# Patient Record
Sex: Female | Born: 1990 | Race: White | Hispanic: No | Marital: Single | Smoking: Never smoker
Health system: Southern US, Community
[De-identification: ages and names within clinical notes are randomized; demographics above are authoritative.]

## PROBLEM LIST (undated history)

## (undated) DIAGNOSIS — J45909 Unspecified asthma, uncomplicated: Secondary | ICD-10-CM

## (undated) DIAGNOSIS — M419 Scoliosis, unspecified: Secondary | ICD-10-CM

## (undated) DIAGNOSIS — B999 Unspecified infectious disease: Secondary | ICD-10-CM

## (undated) HISTORY — PX: TYMPANOSTOMY TUBE PLACEMENT: SHX32

---
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
## 2015-02-07 LAB — OB RESULTS CONSOLE GC/CHLAMYDIA
Chlamydia: NEGATIVE
Gonorrhea: NEGATIVE

## 2015-02-07 LAB — OB RESULTS CONSOLE RUBELLA ANTIBODY, IGM: RUBELLA: IMMUNE

## 2015-02-07 LAB — OB RESULTS CONSOLE RPR: RPR: NONREACTIVE

## 2015-02-07 LAB — OB RESULTS CONSOLE ABO/RH: RH TYPE: POSITIVE

## 2015-02-07 LAB — OB RESULTS CONSOLE HIV ANTIBODY (ROUTINE TESTING): HIV: NONREACTIVE

## 2015-02-07 LAB — OB RESULTS CONSOLE HEPATITIS B SURFACE ANTIGEN: HEP B S AG: NEGATIVE

## 2015-02-07 LAB — OB RESULTS CONSOLE ANTIBODY SCREEN: Antibody Screen: NEGATIVE

## 2015-03-22 LAB — OB RESULTS CONSOLE GBS: GBS: NEGATIVE

## 2015-04-01 NOTE — L&D Delivery Note (Signed)
Delivery Note-called for delivery per Dr. Seymour BarsLavoie request  First Stage: Labor onset: 1600 Augmentation: Pitocin Analgesia Emily Perkins/Anesthesia intrapartum: epidural SROM at 2303-clear  Second Stage: Complete dilation at 0410 Onset of pushing at 0426 FHR second stage 150 baseline, variable decels, Cat II  In maternal lithotomy, delivery of a viable female at [id number] by CNM in LOP position with restitution to OP No nuchal cord Cord double clamped after cessation of pulsation, cut by family member Cord blood sample collected   Collection of cord blood donation n/a Arterial cord blood sample n/a  Third Stage: Placenta delivered via Tomasa BlaseSchultz intact with 3 VC @ 0538 Placenta disposition: routine disposal Uterine tone firm / bleeding small  2nd degree perineal laceration identified  Anesthesia for repair: epidural Repaired with 2-0 Vicryl rapide Est. Blood Loss (mL): 150  Complications: none  Mom to postpartum.  Baby to Couplet care / Skin to Skin.  Newborn: Birth Weight: pending Apgar Scores: 9/9 Feeding planned: bottle  Emily Perkins, Emily Perkins, N MSN, CNM 04/16/2015, 5:54 AM

## 2015-04-12 ENCOUNTER — Other Ambulatory Visit: Payer: Self-pay | Admitting: Obstetrics & Gynecology

## 2015-04-14 ENCOUNTER — Inpatient Hospital Stay (HOSPITAL_COMMUNITY)
Admission: AD | Admit: 2015-04-14 | Discharge: 2015-04-14 | Disposition: A | Discharge status: Home / Self Care | Payer: BLUE CROSS/BLUE SHIELD | Source: Ambulatory Visit | Attending: Obstetrics and Gynecology | Admitting: Obstetrics and Gynecology

## 2015-04-14 ENCOUNTER — Encounter (HOSPITAL_COMMUNITY): Payer: Self-pay | Admitting: *Deleted

## 2015-04-14 HISTORY — DX: Unspecified asthma, uncomplicated: J45.909

## 2015-04-14 HISTORY — DX: Scoliosis, unspecified: M41.9

## 2015-04-14 NOTE — OB Triage Note (Signed)
Emily Perkins is a 25 y.o. G1P0 at 6737w3d by LMP for labor evaluation  Subjective: Chief Complaint  Patient presents with  . Contractions    Objective: BP 133/77 mmHg  Pulse 82  Temp(Src) 97.9 F (36.6 C)  Resp 18  Ht 5\' 5"  (1.651 m)  Wt 71.215 kg (157 lb)  BMI 26.13 kg/m2      FHT:  FHR: 125 bpm, variability: moderate,  accelerations:  Present,  decelerations:  Absent UC:   rare SVE:   2 cm dilated, 80 effaced, -2 station  Labs: No results found for: WBC, HGB, HCT, MCV, PLT  Assessment / Plan: Prodromal labor  Labor: not in labor Fetal Wellbeing:  Category I Pain Control:  Labor support without medications Anticipated MOD:  dc home with precautions.  Emily Perkins J 04/14/2015, 10:10 PM

## 2015-04-14 NOTE — MAU Note (Signed)
Some contractions since yesterday but having a lot of lower back pain tonight. Denies LOF or bleeding. Some white d/c

## 2015-04-15 ENCOUNTER — Inpatient Hospital Stay (HOSPITAL_COMMUNITY): Payer: BLUE CROSS/BLUE SHIELD | Admitting: Anesthesiology

## 2015-04-15 ENCOUNTER — Inpatient Hospital Stay (HOSPITAL_COMMUNITY)
Admission: AD | Admit: 2015-04-15 | Discharge: 2015-04-18 | DRG: 775 | Disposition: A | Discharge status: Home / Self Care | Payer: BLUE CROSS/BLUE SHIELD | Source: Ambulatory Visit | Attending: Obstetrics & Gynecology | Admitting: Obstetrics & Gynecology

## 2015-04-15 ENCOUNTER — Encounter (HOSPITAL_COMMUNITY): Payer: Self-pay | Admitting: *Deleted

## 2015-04-15 DIAGNOSIS — Z822 Family history of deafness and hearing loss: Secondary | ICD-10-CM

## 2015-04-15 DIAGNOSIS — O9952 Diseases of the respiratory system complicating childbirth: Secondary | ICD-10-CM | POA: Diagnosis present

## 2015-04-15 DIAGNOSIS — M419 Scoliosis, unspecified: Secondary | ICD-10-CM | POA: Diagnosis present

## 2015-04-15 DIAGNOSIS — D62 Acute posthemorrhagic anemia: Secondary | ICD-10-CM | POA: Diagnosis not present

## 2015-04-15 DIAGNOSIS — IMO0001 Reserved for inherently not codable concepts without codable children: Secondary | ICD-10-CM

## 2015-04-15 DIAGNOSIS — O9081 Anemia of the puerperium: Secondary | ICD-10-CM | POA: Diagnosis not present

## 2015-04-15 DIAGNOSIS — Z3A39 39 weeks gestation of pregnancy: Secondary | ICD-10-CM | POA: Diagnosis not present

## 2015-04-15 DIAGNOSIS — J45909 Unspecified asthma, uncomplicated: Secondary | ICD-10-CM | POA: Diagnosis present

## 2015-04-15 HISTORY — DX: Unspecified infectious disease: B99.9

## 2015-04-15 LAB — TYPE AND SCREEN
ABO/RH(D): O POS
ANTIBODY SCREEN: NEGATIVE

## 2015-04-15 LAB — CBC
HCT: 35.1 % — ABNORMAL LOW (ref 36.0–46.0)
Hemoglobin: 12 g/dL (ref 12.0–15.0)
MCH: 28.4 pg (ref 26.0–34.0)
MCHC: 34.2 g/dL (ref 30.0–36.0)
MCV: 83 fL (ref 78.0–100.0)
PLATELETS: 283 10*3/uL (ref 150–400)
RBC: 4.23 MIL/uL (ref 3.87–5.11)
RDW: 15.1 % (ref 11.5–15.5)
WBC: 15.1 10*3/uL — AB (ref 4.0–10.5)

## 2015-04-15 MED ORDER — CITRIC ACID-SODIUM CITRATE 334-500 MG/5ML PO SOLN: 30.0000 mL | ORAL | Status: DC | PRN

## 2015-04-15 MED ORDER — LACTATED RINGERS IV SOLN: 500.0000 mL | INTRAVENOUS | Status: DC | PRN

## 2015-04-15 MED ORDER — LACTATED RINGERS IV SOLN
INTRAVENOUS | Status: DC
  Administered 2015-04-15: 19:00:00 via INTRAVENOUS

## 2015-04-15 MED ORDER — OXYTOCIN 10 UNIT/ML IJ SOLN
2.5000 [IU]/h | INTRAVENOUS | Status: DC
  Administered 2015-04-16: 2.5 [IU]/h via INTRAVENOUS
  Filled 2015-04-15: qty 4

## 2015-04-15 MED ORDER — LIDOCAINE HCL (PF) 1 % IJ SOLN
INTRAMUSCULAR | Status: DC | PRN
  Administered 2015-04-15 (×2): 5 mL

## 2015-04-15 MED ORDER — DIPHENHYDRAMINE HCL 50 MG/ML IJ SOLN: 12.5000 mg | INTRAMUSCULAR | Status: DC | PRN

## 2015-04-15 MED ORDER — OXYCODONE-ACETAMINOPHEN 5-325 MG PO TABS: 2.0000 | ORAL_TABLET | ORAL | Status: DC | PRN

## 2015-04-15 MED ORDER — LIDOCAINE HCL (PF) 1 % IJ SOLN
30.0000 mL | INTRAMUSCULAR | Status: DC | PRN
  Filled 2015-04-15: qty 30

## 2015-04-15 MED ORDER — OXYCODONE-ACETAMINOPHEN 5-325 MG PO TABS: 1.0000 | ORAL_TABLET | ORAL | Status: DC | PRN

## 2015-04-15 MED ORDER — OXYTOCIN BOLUS FROM INFUSION: 500.0000 mL | INTRAVENOUS | Status: DC

## 2015-04-15 MED ORDER — EPHEDRINE 5 MG/ML INJ
10.0000 mg | INTRAVENOUS | Status: DC | PRN
  Filled 2015-04-15: qty 2

## 2015-04-15 MED ORDER — FENTANYL 2.5 MCG/ML BUPIVACAINE 1/10 % EPIDURAL INFUSION (WH - ANES)
14.0000 mL/h | INTRAMUSCULAR | Status: DC | PRN
Start: 2015-04-15 — End: 2015-04-16
  Administered 2015-04-15 – 2015-04-16 (×3): 14 mL/h via EPIDURAL
  Filled 2015-04-15 (×3): qty 125

## 2015-04-15 MED ORDER — ACETAMINOPHEN 325 MG PO TABS: 650.0000 mg | ORAL_TABLET | ORAL | Status: DC | PRN

## 2015-04-15 MED ORDER — FLEET ENEMA 7-19 GM/118ML RE ENEM: 1.0000 | ENEMA | RECTAL | Status: DC | PRN

## 2015-04-15 MED ORDER — PHENYLEPHRINE 40 MCG/ML (10ML) SYRINGE FOR IV PUSH (FOR BLOOD PRESSURE SUPPORT)
80.0000 ug | PREFILLED_SYRINGE | INTRAVENOUS | Status: DC | PRN
  Filled 2015-04-15: qty 2

## 2015-04-15 MED ORDER — ONDANSETRON HCL 4 MG/2ML IJ SOLN
4.0000 mg | Freq: Four times a day (QID) | INTRAMUSCULAR | Status: DC | PRN
  Administered 2015-04-15: 4 mg via INTRAVENOUS
  Filled 2015-04-15: qty 2

## 2015-04-15 NOTE — Anesthesia Procedure Notes (Signed)
Epidural Patient location during procedure: OB  Staffing Anesthesiologist: Phillips GroutARIGNAN, Kariann Wecker Performed by: anesthesiologist   Preanesthetic Checklist Completed: patient identified, site marked, surgical consent, pre-op evaluation, timeout performed, IV checked, risks and benefits discussed and monitors and equipment checked  Epidural Patient position: sitting Prep: DuraPrep Patient monitoring: heart rate, continuous pulse ox and blood pressure Approach: right paramedian Location: L2-L3 Injection technique: LOR saline  Needle:  Needle type: Tuohy  Needle gauge: 17 G Needle length: 9 cm and 9 Needle insertion depth: 5 cm Catheter type: closed end flexible Catheter size: 20 Guage Catheter at skin depth: 10 cm Test dose: negative  Assessment Events: blood not aspirated, injection not painful, no injection resistance, negative IV test and no paresthesia  Additional Notes Patient identified. Risks/Benefits/Options discussed with patient including but not limited to bleeding, infection, nerve damage, paralysis, failed block, incomplete pain control, headache, blood pressure changes, nausea, vomiting, reactions to medication both or allergic, itching and postpartum back pain. Confirmed with bedside nurse the patient's most recent platelet count. Confirmed with patient that they are not currently taking any anticoagulation, have any bleeding history or any family history of bleeding disorders. Patient expressed understanding and wished to proceed. All questions were answered. Sterile technique was used throughout the entire procedure. Please see nursing notes for vital signs. Test dose was given through epidural needle and negative prior to continuing to dose epidural or start infusion. Warning signs of high block given to the patient including shortness of breath, tingling/numbness in hands, complete motor block, or any concerning symptoms with instructions to call for help. Patient was given  instructions on fall risk and not to get out of bed. All questions and concerns addressed with instructions to call with any issues.

## 2015-04-15 NOTE — H&P (Signed)
Emily Perkins is a 25 y.o. female G1P0 2975w4d presenting for UCs.  Baby for adoption.  HPP/HPI:  Late PN care, 1st visit with [facility] at 30+ wks.  No genetic screening.  US anato 3rd trimester wnl, but suboptimal because of advanced gestation.  Normal 3rd trimester.  Good interval growth by US.  OB History    Gravida Para Term Preterm AB TAB SAB Ectopic Multiple Living   1         0     Past Medical History  Diagnosis Date  . Asthma     exercise induced  . Scoliosis   . Infection     UTI   Past Surgical History  Procedure Laterality Date  . Wisdom tooth extraction  2011  . Anterior cruciate ligament repair  2008  . Wrist surgery Right 2006  . Tympanostomy tube placement     Family History: family history includes Hearing loss in her father. There is no history of Cancer, Asthma, Diabetes, Hypertension, or Stroke. Social History:  reports that she has never smoked. She has never used smokeless tobacco. She reports that she drinks alcohol. She reports that she does not use illicit drugs.  Allergies  Allergen Reactions  . Penicillins Hives, Itching and Other (See Comments)    Has patient had a PCN reaction causing immediate rash, facial/tongue/throat swelling, SOB or lightheadedness with hypotension: No Has patient had a PCN reaction causing severe rash involving mucus membranes or skin necrosis: No Has patient had a PCN reaction that required hospitalization No Has patient had a PCN reaction occurring within the last 10 years: Yes If all of the above answers are "NO", then may proceed with Cephalosporin use.    Dilation: 8 Effacement (%): 100 Station: -2 Exam by:: Jene EveryJen Daly, RN   Blood pressure 139/89, pulse 102, temperature 98.3 F (36.8 C), temperature source Oral, resp. rate 18, height 5\' 5"  (1.651 m), weight 157 lb (71.215 kg), SpO2 99 %. Exam Physical Exam   FHR 140's with good variability and accelerations.  Rare mild variable decelerations. UCs q3-4 min.  Pain  controled by epidural.  HPP:  Patient Active Problem List   Diagnosis Date Noted  . Active labor at term 04/15/2015    Prenatal labs: ABO, Rh: --/--/O POS (01/15 1955) Antibody: NEG (01/15 1955) Rubella: Immune RPR: Nonreactive (11/09 0000)  HBsAg: Negative (11/09 0000)  HIV: Non-reactive (11/09 0000)  Genetic testing: Not done US anato: wnl, suboptimal because of advanced gestation 1 hr GTT: Abnormal.  3 hr GTT 1 abnormal value  GBS: Negative (12/22 0000)   Assessment and Plan:  G1 at term in active labor.  FHR Cat 1.  Pain controled with epidural.  Expectant management towards probable vaginal delivery.  Emily Perkins,Emily Perkins 04/15/2015, 11:25 PM

## 2015-04-15 NOTE — MAU Note (Signed)
Irregular contractions, getting stronger, no bleeding or leaking

## 2015-04-15 NOTE — Anesthesia Preprocedure Evaluation (Signed)
Anesthesia Evaluation  Patient identified by MRN, date of birth, ID band Patient awake    Reviewed: Allergy & Precautions, H&P , NPO status , Patient's Chart, lab work & pertinent test results  History of Anesthesia Complications Negative for: history of anesthetic complications  Airway Mallampati: II  TM Distance: >3 FB Neck ROM: full    Dental no notable dental hx. (+) Teeth Intact   Pulmonary neg pulmonary ROS, asthma ,    Pulmonary exam normal breath sounds clear to auscultation       Cardiovascular negative cardio ROS Normal cardiovascular exam Rhythm:regular Rate:Normal     Neuro/Psych negative neurological ROS  negative psych ROS   GI/Hepatic negative GI ROS, Neg liver ROS,   Endo/Other  negative endocrine ROS  Renal/GU negative Renal ROS  negative genitourinary   Musculoskeletal   Abdominal   Peds  Hematology negative hematology ROS (+)   Anesthesia Other Findings   Reproductive/Obstetrics (+) Pregnancy                             Anesthesia Physical Anesthesia Plan  ASA: II  Anesthesia Plan: Epidural   Post-op Pain Management:    Induction:   Airway Management Planned:   Additional Equipment:   Intra-op Plan:   Post-operative Plan:   Informed Consent: I have reviewed the patients History and Physical, chart, labs and discussed the procedure including the risks, benefits and alternatives for the proposed anesthesia with the patient or authorized representative who has indicated his/her understanding and acceptance.     Plan Discussed with:   Anesthesia Plan Comments:         Anesthesia Quick Evaluation  

## 2015-04-16 ENCOUNTER — Encounter (HOSPITAL_COMMUNITY): Payer: Self-pay | Admitting: General Practice

## 2015-04-16 LAB — RPR: RPR: NONREACTIVE

## 2015-04-16 LAB — ABO/RH: ABO/RH(D): O POS

## 2015-04-16 MED ORDER — OXYCODONE-ACETAMINOPHEN 5-325 MG PO TABS
2.0000 | ORAL_TABLET | ORAL | Status: DC | PRN
Start: 2015-04-16 — End: 2015-04-18
  Filled 2015-04-16: qty 2

## 2015-04-16 MED ORDER — OXYTOCIN 10 UNIT/ML IJ SOLN
1.0000 m[IU]/min | INTRAVENOUS | Status: DC
  Administered 2015-04-16: 1 m[IU]/min via INTRAVENOUS

## 2015-04-16 MED ORDER — ONDANSETRON HCL 4 MG/2ML IJ SOLN: 4.0000 mg | INTRAMUSCULAR | Status: DC | PRN

## 2015-04-16 MED ORDER — BENZOCAINE-MENTHOL 20-0.5 % EX AERO
1.0000 "application " | INHALATION_SPRAY | CUTANEOUS | Status: DC | PRN
  Administered 2015-04-16: 1 via TOPICAL
  Filled 2015-04-16: qty 56

## 2015-04-16 MED ORDER — ONDANSETRON HCL 4 MG PO TABS: 4.0000 mg | ORAL_TABLET | ORAL | Status: DC | PRN

## 2015-04-16 MED ORDER — PRENATAL MULTIVITAMIN CH
1.0000 | ORAL_TABLET | Freq: Every day | ORAL | Status: DC
  Filled 2015-04-16 (×2): qty 1

## 2015-04-16 MED ORDER — ACETAMINOPHEN 325 MG PO TABS
650.0000 mg | ORAL_TABLET | ORAL | Status: DC | PRN
  Administered 2015-04-17 – 2015-04-18 (×2): 650 mg via ORAL
  Filled 2015-04-16 (×2): qty 2

## 2015-04-16 MED ORDER — SENNOSIDES-DOCUSATE SODIUM 8.6-50 MG PO TABS
2.0000 | ORAL_TABLET | ORAL | Status: DC
  Administered 2015-04-17 (×2): 2 via ORAL
  Filled 2015-04-16 (×2): qty 2

## 2015-04-16 MED ORDER — IBUPROFEN 600 MG PO TABS
600.0000 mg | ORAL_TABLET | Freq: Four times a day (QID) | ORAL | Status: DC
  Administered 2015-04-16 – 2015-04-18 (×9): 600 mg via ORAL
  Filled 2015-04-16 (×9): qty 1

## 2015-04-16 MED ORDER — OXYCODONE-ACETAMINOPHEN 5-325 MG PO TABS
1.0000 | ORAL_TABLET | ORAL | Status: DC | PRN
  Administered 2015-04-16 (×2): 1 via ORAL
  Filled 2015-04-16: qty 1

## 2015-04-16 MED ORDER — TERBUTALINE SULFATE 1 MG/ML IJ SOLN
0.2500 mg | Freq: Once | INTRAMUSCULAR | Status: DC | PRN
  Filled 2015-04-16: qty 1

## 2015-04-16 NOTE — Progress Notes (Signed)
CSW contacted prior to MOB's arrival to the hospital by [employer] due to [facility] working with adoption agency to create an Visual merchandiser.  Kendrick Fries [phone] or [phone]) is the Development worker, international aid.   CSW received a copy of the adoption plan, and noted that the agency has identified an adoptive family.  CSW briefly met with MOB in order to introduce self.  MOB presented as tired and exhausted, and requested that CSW return at a later time.  CSW provided MOB with contact information, and requested call when she is ready for the visit.  CSW to follow up.

## 2015-04-16 NOTE — Progress Notes (Signed)
Subjective: Doing well, pain controled, UCs q2-3 min  Anesthesia epidural   Objective: BP 109/67 mmHg  Pulse 86  Temp(Src) 97.8 F (36.6 C) (Axillary)  Resp 18  Ht 5\' 5"  (1.651 m)  Wt 157 lb (71.215 kg)  BMI 26.13 kg/m2  SpO2 99%   FHT:  FHR: 140-150 bpm, variability: moderate,  accelerations:  Present,  decelerations:  Present Moderate variables UC:   regular, every 2-3 minutes VE:   Dilation: 9 Effacement (%): 100 Station: 0 Exam by:: Dr. Seymour BarsLavoie   Occiput post right   Assessment / Plan: Augmentation of labor, progressing well, but slow from 8 to 10.  Peanut in left decubitus.   Fetal Wellbeing:  Category I-2 Pain Control:  Epidural  Anticipated MOD:  NSVD  Emily Perkins,Emily Perkins 04/16/2015, 3:40 AM

## 2015-04-16 NOTE — Progress Notes (Signed)
Patient ID: Kela MillinClaire Perkins, female   DOB: 03-25-1991, 25 y.o.   MRN: [id number] INTERVAL NOTE: PPD #0 - SVD with 2nd degree laceration  S:   Sleeping intermittently, min cramping, (+) voids, small bleed, denies HA/NV/dizziness  O:   VSS, AAO x 3, NAD  U-even  Scant lochia  A / P:   PPD #0  Stable post partum  Routine PP orders  Kenard GowerAWSON, Bard Haupert, M, MSN, CNM 04/16/2015, 9:58 AM

## 2015-04-16 NOTE — Anesthesia Postprocedure Evaluation (Signed)
Anesthesia Post Note  Patient: Kela MillinClaire Monnin  Procedure(s) Performed: * No procedures listed *  Patient location during evaluation: Mother Baby Anesthesia Type: Epidural Level of consciousness: awake Pain management: satisfactory to patient Vital Signs Assessment: post-procedure vital signs reviewed and stable Respiratory status: spontaneous breathing Cardiovascular status: stable Anesthetic complications: no    Last Vitals:  Filed Vitals:   04/16/15 0815 04/16/15 0915  BP: 90/64 112/65  Pulse: 99 67  Temp: 37.1 C 37.1 C  Resp: 16 16    Last Pain:  Filed Vitals:   04/16/15 1219  PainSc: 7                  Domingue Coltrain

## 2015-04-16 NOTE — Clinical Social Work Maternal (Signed)
CLINICAL SOCIAL WORK MATERNAL/CHILD NOTE  Patient Details  Name: Emily Perkins MRN: [id number] Date of Birth: 22-Dec-1990  Date:  04/16/2015  Clinical Social Worker Initiating Note:  Loleta BooksSarah Hendry Speas MSW, LCSW Date/ Time Initiated:  04/16/15/1500    Child's Name:  unnamed    Legal Guardian:  Emily Perkins  Need for Interpreter:  None   Date of Referral:  04/16/15     Reason for Referral:  Adoption    [facility] Source[facility]  [facility]   Address:  [address] [place], [place] [address]  Phone number:  [phone]   Household Members:  Parents   Natural Supports (not living in the home):  Immediate Family, Extended Family   Professional Supports: Sherryl Bartersachel Hall (Contractoradoption counselor), Therapist   Employment: Full-time   Type of Work:   Diplomatic Services operational officerphotography, Therapist, occupationalservice industry  Education:  IT sales professionalCollege graduate   Financial Resources:  Media plannerrivate Insurance   Other Resources:      Cultural/Religious Considerations Which May Impact Care:  None reported  Strengths:  Ability to meet basic needs    Risk Factors/Current Problems:  None   Cognitive State:  Able to Concentrate , Alert , Linear Thinking , Goal Oriented    Mood/Affect:  Happy , Comfortable , Calm    CSW Assessment:  CSW received request for consult since the MOB presented with plans to place the infant up for adoption.  MOB's best friend and mother were also present upon CSW arrival, but they removed themselves from the room in order to provide the MOB space and privacy.  MOB presented in a pleasant mood, displayed a full range in affect, and was receptive to the visit.  CSW shared reason for the visit, and the MOB immediately began discussing the events that led to her decision to place the infant up for adoption. She shared that she learned that she was pregnant at [redacted] weeks, and immediately felt that it was best for the infant to create an adoption plan. Per MOB, she felt that she was not in a place financially and emotionally to  parent. She stated that her family has always been supportive, and they assisted her to identify an adoption agency. MOB reported that she learned about [facility], and was drawn to the agency since it allowed her to guide the adoption plan. She stated that she was able to help choose the adoptive parents, and is able to guide the extent of the open adoption.  MOB shared that she is happy and content with her current plans to place the infant up for adoption, and is confident in her decision.    MOB shared that she has extensive family and friend support. She stated that [place] living in [place] 7 years, but chose to move back to [place] during the pregnancy due to comfort with her OBGYN and in order to have the support from her parents.  She discussed upcoming job interview in [place] this week, and believing that she will continue to feel supported by her support system once she returns to living in [place] full time.  MOB reported that her adoption worker has informed her of her legal rights, and her ability to change her mind about the adoption within 7 days of signing relinquishment paperwork.  MOB discussed how she is currently receiving therapy at [facility], and feels well supported by her therapist. She stated that she initiated therapy early in the adoption process, and shared belief that it has been helpful for her  as she continued the pregnancy. MOB discussed motivation to continue to work with her therapist in order to continue to assist her adjust to the adoption.   MOB stated that she wants to continue to care for the infant until she and the infant are medically ready for discharge, and reported that once the adoptive parents arrive from out of town, she does not want them in her hospital room.   CSW Plan/Description:   CSW spoke with Sherryl Barters, Medical sales representative. CSW informed Medical sales representative of tentative plan of care of infant in order to  arrange for time to sign relinquishment paperwork.  Per Medical sales representative, she will plan on being at the hospital on the morning of 1/17 (between 8am and 10am) to complete adoption paperwork with the birth mother if birth mother is ready.   Per Medical sales representative, the adoptive parents will be arriving in [place] this evening, and will be staying at a hotel. She stated that they will remain in [place] until inter-state adoption paperwork has been completed.    CSW to continue to be involved during admission in order to facilitate the adoption paperwork and to provide ongoing support to the MOB.   Pervis Hocking, LCSW 04/16/2015, 4:29 PM

## 2015-04-16 NOTE — Progress Notes (Signed)
Left a message with SW concerning the private adoption.  Joesph Julyoletta Morley Gaumer, RN

## 2015-04-17 LAB — CBC
HCT: 29.6 % — ABNORMAL LOW (ref 36.0–46.0)
Hemoglobin: 9.6 g/dL — ABNORMAL LOW (ref 12.0–15.0)
MCH: 27.7 pg (ref 26.0–34.0)
MCHC: 32.4 g/dL (ref 30.0–36.0)
MCV: 85.3 fL (ref 78.0–100.0)
PLATELETS: 192 10*3/uL (ref 150–400)
RBC: 3.47 MIL/uL — ABNORMAL LOW (ref 3.87–5.11)
RDW: 15.8 % — AB (ref 11.5–15.5)
WBC: 15 10*3/uL — AB (ref 4.0–10.5)

## 2015-04-17 NOTE — Progress Notes (Signed)
PPD 1 SVD with 2nd degree repair  S:  Reports feeling tired and sore / some backache             Tolerating po/ No nausea or vomiting             Bleeding is light             Pain controlled with motrin             Up ad lib / ambulatory / voiding QS  Newborn formula feeding  / BUFA  O:               VS: BP 109/70 mmHg  Pulse 69  Temp(Src) 98.3 F (36.8 C) (Oral)  Resp 18  Ht  (1.651 m)  Wt 71.215 kg (157 lb)  BMI 26.13 kg/m2  SpO2 99%  Breastfeeding? Unknown   LABS:              Recent Labs  04/15/15 1835 04/17/15 0550  WBC 15.1* 15.0*  HGB 12.0 9.6*  PLT 283 192               Blood type: --/--/O POS, O POS (01/15 1955)  Rubella: Immune (11/09 0000)                                Physical Exam:             Alert and oriented X3  Abdomen: soft, non-tender, non-distended              Fundus: firm, non-tender, U-1  Lochia: light  Extremities: no edema, no calf pain or tenderness    A: PPD # 1 with 2nd degree repair             ABL anemia   Doing well - stable status  P: Routine post partum orders  Iron and magnesium with anticipated DC tomorrow             SW consult related to BUFA plan  Marlinda Mike CNM, MSN, [facility] 04/17/2015, 10:39 AM

## 2015-04-17 NOTE — Progress Notes (Addendum)
CSW has spoken with Emily Perkins, adoption worker, in order to coordinate completing of relinquishment paperwork.  She stated that she will remain in contact with CSW in order to coordinate. On 1/16, birth mother had requested to remain the primary caregiver for the infant until they both were ready for discharge. Due to request, relinquishment paperwork anticipated to be signed on 1/18 since birth mother has not yet been discharged.   Update at 3:15pm: CSW received phone call from [facility], who confirmed that relinquishment paperwork will not be completed today.   She stated that the birth mother may be expressing desire to parent the infant instead of completing the adoption plan.  CSW attempted to meet with the birth mother in order to provide support and to assist her current thoughts and feelings surrounding the adoption.  Birth mother was in the restroom, but her father was present. CSW reintroduced self and availability if the birth mother desires to process her feelings.  Her father expressed appreciation, and confirmed receipt of CSW contact information.  CSW to follow up on 1/18.

## 2015-04-18 DIAGNOSIS — D62 Acute posthemorrhagic anemia: Secondary | ICD-10-CM | POA: Diagnosis not present

## 2015-04-18 MED ORDER — POLYSACCHARIDE IRON COMPLEX 150 MG PO CAPS
150.0000 mg | ORAL_CAPSULE | Freq: Every day | ORAL | Status: DC
  Administered 2015-04-18: 150 mg via ORAL
  Filled 2015-04-18: qty 1

## 2015-04-18 MED ORDER — IBUPROFEN 600 MG PO TABS: 600.0000 mg | ORAL_TABLET | Freq: Four times a day (QID) | ORAL | Status: AC

## 2015-04-18 MED ORDER — POLYSACCHARIDE IRON COMPLEX 150 MG PO CAPS: 150.0000 mg | ORAL_CAPSULE | Freq: Every day | ORAL | Status: AC

## 2015-04-18 NOTE — Progress Notes (Signed)
Sherryl Barters, Medical sales representative, arrived at the hospital to complete relinquishment paperwork. CSW received copy of notarized paperwork, and provided copy to RN in [employer].  Infant is now in the legal and physical custody of the adoption agency.   No barriers to discharge for birth mother.

## 2015-04-18 NOTE — Progress Notes (Signed)
PPD #2- SVD  Subjective:   Reports feeling well Tolerating po/ No nausea or vomiting Bleeding is light Pain controlled with Motrin Up ad lib / ambulatory / voiding without problems Newborn: bottlefeeding    Objective:   VS: VS:  Filed Vitals:   04/16/15 1831 04/17/15 0500 04/17/15 1828 04/18/15 0707  BP: 109/66 109/70 112/63 114/66  Pulse: 63 69 80 60  Temp: 98.2 F (36.8 C) 98.3 F (36.8 C) 98.2 F (36.8 C) 98 F (36.7 C)  TempSrc: Oral Oral Oral Oral  Resp: Height:      Weight:      SpO2:    99%    LABS:  Recent Labs  04/15/15 1835 04/17/15 0550  WBC 15.1* 15.0*  HGB 12.0 9.6*  PLT 283 192   Blood type: --/--/O POS, O POS (01/15 1955) Rubella: Immune (11/09 0000)                I&O: Intake/Output    None     Physical Exam: Alert and oriented X3 Abdomen: soft, non-tender, non-distended  Fundus: firm, non-tender, U-3 Perineum: Well approximated, no significant erythema, edema, or drainage; healing well. Lochia: small Extremities: No edema, no calf pain or tenderness   Assessment: PPD #2  G1P1001/ S/P:spontaneous vaginal ABL anemia Doing well - stable for discharge home  Plan: Discharge home RX's:  Ibuprofen  po Q 6 hrs prn pain #30 Refill x 0 Niferex  po QD #30 Refill x 1 Risk for PPD-warning signs discussed-interval f/u in 2 wks at [facility] Follow up in 6 wks at [facility] for postpartum visit [facility] booklet given    Donette Larry, N MSN, CNM 04/18/2015, 9:20 AM

## 2015-04-18 NOTE — Discharge Summary (Signed)
Obstetric Discharge Summary Reason for Admission: onset of labor and [redacted] weeks gestation Prenatal Course: complicated by late [facility], BUFA-private Intrapartum Procedures: spontaneous vaginal delivery and epidural Postpartum Procedures: none Complications-Operative and Postpartum: 2nd degree perineal laceration HEMOGLOBIN  Date Value Ref Range Status  04/17/2015 9.6* 12.0 - 15.0 g/dL Final   HCT  Date Value Ref Range Status  04/17/2015 29.6* 36.0 - 46.0 % Final    Physical Exam:  General: alert, cooperative and no distress Lochia: appropriate Uterine Fundus: firm Incision: healing well, no significant drainage, no dehiscence, no significant erythema DVT Evaluation: No evidence of DVT seen on physical exam. Negative Homan's sign. No cords or calf tenderness. No significant calf/ankle edema.  Discharge Diagnoses: Term Pregnancy-delivered  Discharge Information: Date: 04/18/2015 Activity: pelvic rest Diet: routine Medications: PNV, Ibuprofen and Iron Condition: stable Instructions: refer to practice specific booklet Discharge to: home Follow-up Information    Follow up with LAVOIE,MARIE-LYNE, MD. Schedule an appointment as soon as possible for a visit in 2 weeks.   Specialty:  Obstetrics and Gynecology   Why:  mood eval   Contact information:   [address] [place] [address] [phone]       Follow up with LAVOIE,MARIE-LYNE, MD. Schedule an appointment as soon as possible for a visit in 6 weeks.   Specialty:  Obstetrics and Gynecology   Contact information:   [address] [place] [address] [phone]       Newborn Data: Live born female  Birth Weight: 6 lb 9.6 oz (2995 g) APGAR: 9, 9  Home with adoptive parents.  Maritssa Haughton, N 04/18/2015, 10:08 AM

## 2015-04-23 ENCOUNTER — Inpatient Hospital Stay (HOSPITAL_COMMUNITY): Admission: RE | Admit: 2015-04-23 | Payer: BLUE CROSS/BLUE SHIELD | Source: Ambulatory Visit
